# Patient Record
Sex: Female | Born: 1955 | Race: Black or African American | Hispanic: No | Marital: Single | Smoking: Former smoker
Health system: Southern US, Community
[De-identification: ages and names within clinical notes are randomized; demographics above are authoritative.]

---

## 1999-07-29 ENCOUNTER — Other Ambulatory Visit: Admission: RE | Admit: 1999-07-29 | Discharge: 1999-07-29 | Payer: Self-pay | Admitting: *Deleted

## 2002-06-07 ENCOUNTER — Other Ambulatory Visit: Admission: RE | Admit: 2002-06-07 | Discharge: 2002-06-07 | Payer: Self-pay | Admitting: Radiology

## 2004-04-14 ENCOUNTER — Other Ambulatory Visit: Admission: RE | Admit: 2004-04-14 | Discharge: 2004-04-14 | Payer: Self-pay | Admitting: Family Medicine

## 2006-02-10 ENCOUNTER — Other Ambulatory Visit: Admission: RE | Admit: 2006-02-10 | Discharge: 2006-02-10 | Payer: Self-pay | Admitting: Family Medicine

## 2007-11-04 ENCOUNTER — Other Ambulatory Visit: Admission: RE | Admit: 2007-11-04 | Discharge: 2007-11-04 | Payer: Self-pay | Admitting: Family Medicine

## 2008-08-03 ENCOUNTER — Encounter: Admission: RE | Admit: 2008-08-03 | Discharge: 2008-08-03 | Payer: Self-pay | Admitting: Family Medicine

## 2010-04-02 ENCOUNTER — Encounter: Admission: RE | Admit: 2010-04-02 | Discharge: 2010-04-02 | Payer: Self-pay | Admitting: Infectious Diseases

## 2011-04-18 ENCOUNTER — Emergency Department (HOSPITAL_COMMUNITY)
Admission: EM | Admit: 2011-04-18 | Discharge: 2011-04-19 | Disposition: A | Discharge status: Home / Self Care | Payer: 59 | Attending: Emergency Medicine | Admitting: Emergency Medicine

## 2011-04-18 ENCOUNTER — Emergency Department (HOSPITAL_COMMUNITY): Payer: 59

## 2011-04-18 DIAGNOSIS — R079 Chest pain, unspecified: Secondary | ICD-10-CM | POA: Insufficient documentation

## 2011-04-18 DIAGNOSIS — R51 Headache: Secondary | ICD-10-CM | POA: Insufficient documentation

## 2011-04-18 DIAGNOSIS — R5381 Other malaise: Secondary | ICD-10-CM | POA: Insufficient documentation

## 2011-04-18 LAB — POCT CARDIAC MARKERS
CKMB, poc: 2.1 ng/mL (ref 1.0–8.0)
Myoglobin, poc: 67.8 ng/mL (ref 12–200)
Troponin i, poc: <0.05 ng/mL (ref 0.00–0.09)

## 2011-04-18 LAB — CBC
MCV: 90 fL (ref 78.0–100.0)
Platelets: 253 10*3/uL (ref 150–400)
RDW: 13.2 % (ref 11.5–15.5)
WBC: 4.3 10*3/uL (ref 4.0–10.5)

## 2011-04-18 LAB — COMPREHENSIVE METABOLIC PANEL
Albumin: 3.8 g/dL (ref 3.5–5.2)
BUN: 6 mg/dL (ref 6–23)
Chloride: 101 mEq/L (ref 96–112)
Creatinine, Ser: 0.5 mg/dL (ref 0.4–1.2)
Total Bilirubin: 0.3 mg/dL (ref 0.3–1.2)

## 2011-04-18 LAB — PROTIME-INR
INR: 0.94 (ref 0.00–1.49)
Prothrombin Time: 12.8 seconds (ref 11.6–15.2)

## 2011-04-18 LAB — DIFFERENTIAL
Basophils Absolute: 0 10*3/uL (ref 0.0–0.1)
Eosinophils Absolute: 0 10*3/uL (ref 0.0–0.7)
Monocytes Absolute: 0.2 10*3/uL (ref 0.1–1.0)
Neutrophils Relative %: 51 % (ref 43–77)

## 2011-04-18 LAB — APTT: aPTT: 33 seconds (ref 24–37)

## 2011-04-18 LAB — POCT I-STAT, CHEM 8
BUN: 5 mg/dL — ABNORMAL LOW (ref 6–23)
Chloride: 102 mEq/L (ref 96–112)
Creatinine, Ser: 0.9 mg/dL (ref 0.4–1.2)
Sodium: 137 mEq/L (ref 135–145)

## 2011-04-19 LAB — URINALYSIS, ROUTINE W REFLEX MICROSCOPIC
Hgb urine dipstick: NEGATIVE
Nitrite: NEGATIVE
Specific Gravity, Urine: 1.006 (ref 1.005–1.030)
Urobilinogen, UA: 0.2 mg/dL (ref 0.0–1.0)

## 2012-02-07 IMAGING — CR DG CHEST 1V PORT
1 series · 1 of 1 positions shown · non-contrast
Comparison: None.

CLINICAL DATA: Chest pain

PORTABLE CHEST - 1 VIEW

[AP]
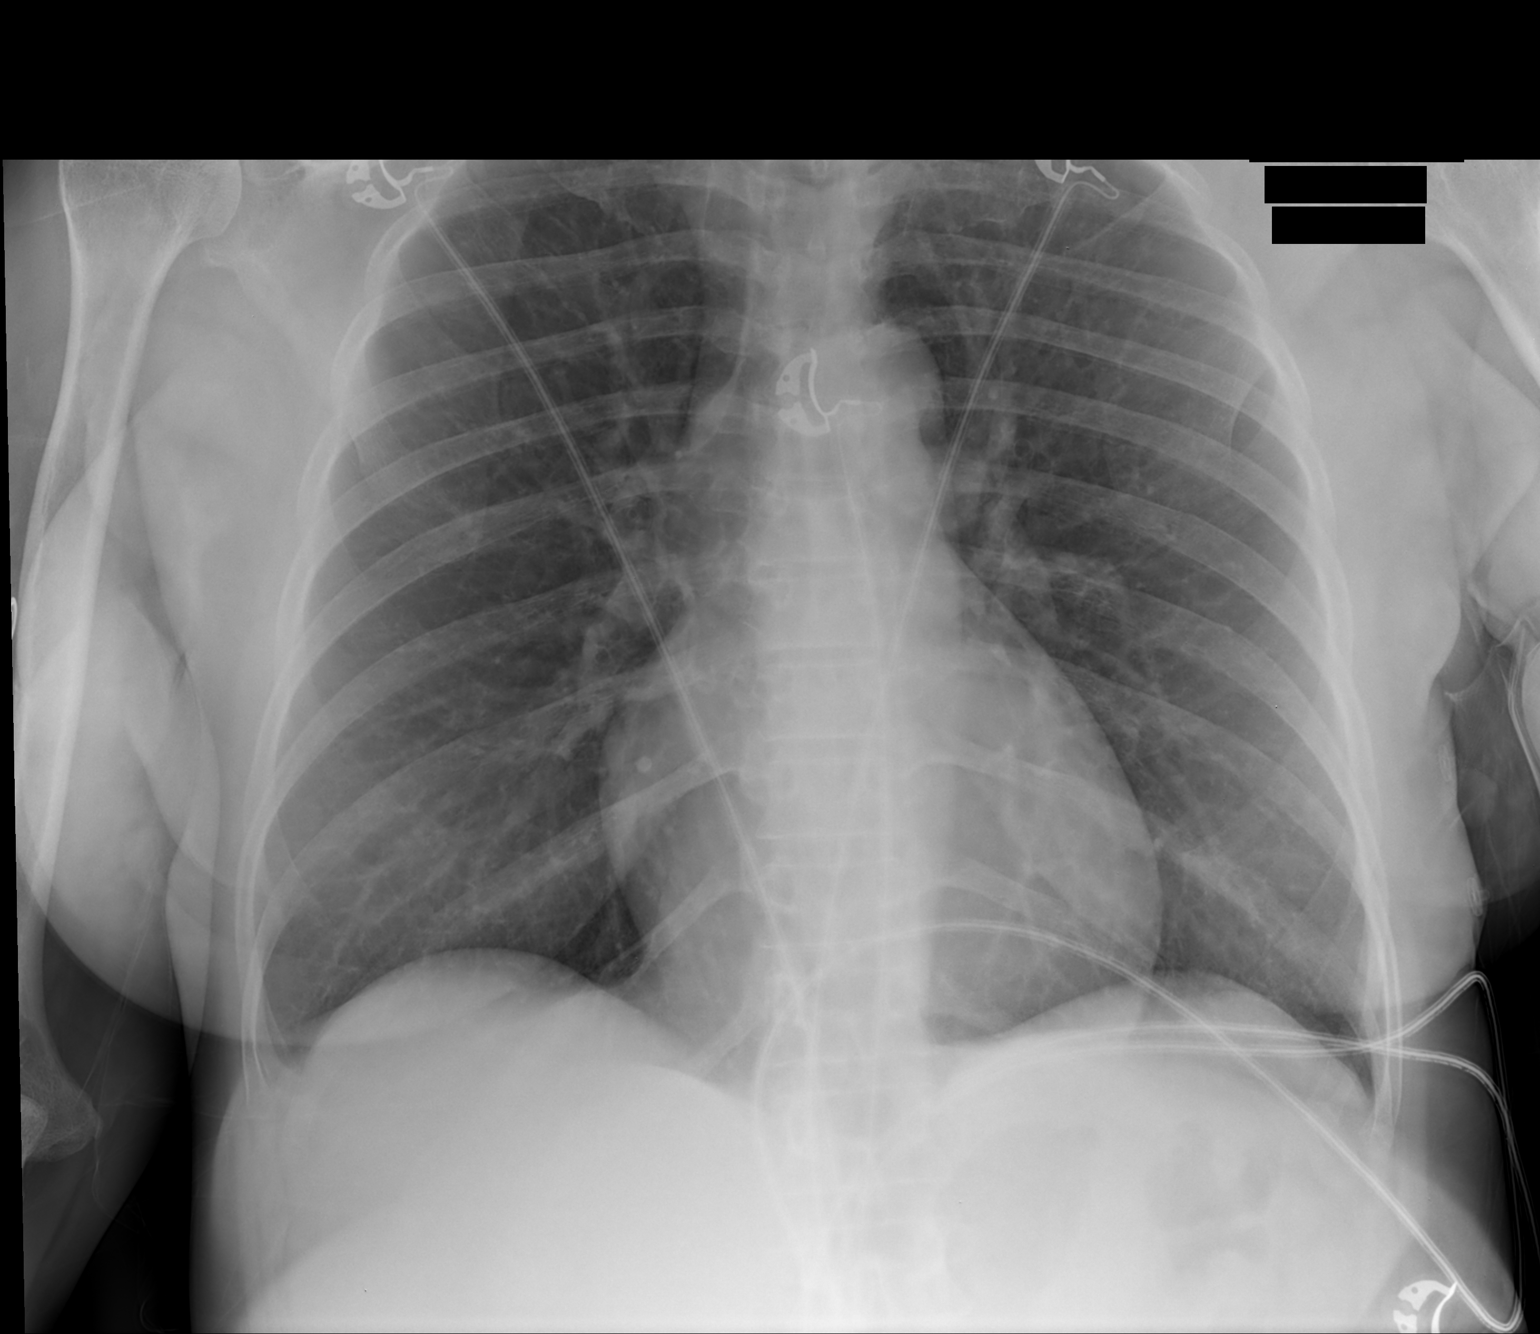

[1 of 1 positions shown; findings below may reference images not displayed]

FINDINGS: Lungs are clear. No pleural effusion or pneumothorax.

Cardiomediastinal silhouette is within normal limits.

Visualized osseous structures are within normal limits.
IMPRESSION: No evidence of acute cardiopulmonary disease.

## 2014-09-17 ENCOUNTER — Ambulatory Visit (INDEPENDENT_AMBULATORY_CARE_PROVIDER_SITE_OTHER): Payer: BC Managed Care – PPO | Admitting: Podiatry

## 2014-09-17 ENCOUNTER — Ambulatory Visit (INDEPENDENT_AMBULATORY_CARE_PROVIDER_SITE_OTHER): Payer: BC Managed Care – PPO

## 2014-09-17 ENCOUNTER — Encounter: Payer: Self-pay | Admitting: Podiatry

## 2014-09-17 VITALS — BP 140/85 | HR 83 | Resp 15 | Ht 61.5 in | Wt 153.0 lb

## 2014-09-17 DIAGNOSIS — M79671 Pain in right foot: Secondary | ICD-10-CM

## 2014-09-17 DIAGNOSIS — M722 Plantar fascial fibromatosis: Secondary | ICD-10-CM

## 2014-09-17 NOTE — Patient Instructions (Signed)
Bent - Knee Calf Stretch  1) Stand an arm's length away from a wall. Place the palms of your hands on the wall. Step forward about 12 inches with the opposite foot.  2) Keeping toes pointed forward and both heels on the floor, bend both knees and lean forward. Hold this position for 60 seconds. Don't arch your back and don't hunch your shoulders.  3) Repeat this twice.  DO THIS STRETCHING TECHNIQUE 3 TIMES A DAY.   Stretching Exercises before Standing      Pull your toes up toward your nose and hold for 1 minute before standing.  A towel can assist with this exercise if you put the towel under the ball of your foot. This exercise reduces the intense    pain associated when changing from a seated to a standing position. This stretch can usually be the most beneficial if done before getting out of bed in the mornings. Plantar Fasciitis Plantar fasciitis is a common condition that causes foot pain. It is soreness (inflammation) of the band of tough fibrous tissue on the bottom of the foot that runs from the heel bone (calcaneus) to the ball of the foot. The cause of this soreness may be from excessive standing, poor fitting shoes, running on hard surfaces, being overweight, having an abnormal walk, or overuse (this is common in runners) of the painful foot or feet. It is also common in aerobic exercise dancers and ballet dancers. SYMPTOMS  Most people with plantar fasciitis complain of:  Severe pain in the morning on the bottom of their foot especially when taking the first steps out of bed. This pain recedes after a few minutes of walking.  Severe pain is experienced also during walking following a long period of inactivity.  Pain is worse when walking barefoot or up stairs DIAGNOSIS   Your caregiver will diagnose this condition by examining and feeling your foot.  Special tests such as X-rays of your foot, are usually not needed. PREVENTION   Consult a sports medicine professional  before beginning a new exercise program.  Walking programs offer a good workout. With walking there is a lower chance of overuse injuries common to runners. There is less impact and less jarring of the joints.  Begin all new exercise programs slowly. If problems or pain develop, decrease the amount of time or distance until you are at a comfortable level.  Wear good shoes and replace them regularly.  Stretch your foot and the heel cords at the back of the ankle (Achilles tendon) both before and after exercise.  Run or exercise on even surfaces that are not hard. For example, asphalt is better than pavement.  Do not run barefoot on hard surfaces.  If using a treadmill, vary the incline.  Do not continue to workout if you have foot or joint problems. Seek professional help if they do not improve. HOME CARE INSTRUCTIONS   Avoid activities that cause you pain until you recover.  Use ice or cold packs on the problem or painful areas after working out.  Only take over-the-counter or prescription medicines for pain, discomfort, or fever as directed by your caregiver.  Soft shoe inserts or athletic shoes with air or gel sole cushions may be helpful.  If problems continue or become more severe, consult a sports medicine caregiver or your own health care provider. Cortisone is a potent anti-inflammatory medication that may be injected into the painful area. You can discuss this treatment with your caregiver. MAKE   SURE YOU:   Understand these instructions.  Will watch your condition.  Will get help right away if you are not doing well or get worse. Document Released: 08/11/2001 Document Revised: 02/08/2012 Document Reviewed: 10/10/2008 [employer] Patient Information 2015 [employer]. This information is not intended to replace advice given to you by your health care provider. Make sure you discuss any questions you have with your health care provider.  

## 2014-09-17 NOTE — Progress Notes (Signed)
   Subjective:    Patient ID: Latoya Scott, female    DOB: November 05, 1956, 58 y.o.   MRN: [id number]  HPI Comments: N heel pain L left right medial heel  D 5 - 6 months O  C sharp pain A walking  T none  Patient presents today complaining of right inferior heel pain upon standing walking relieved with rest she changed shoes and is wearing soft over-the-counter cushions in the right heel with minimal improvement of right heel pain.  She also complaining of a painful keratoses on the plantar left heel  Review of Systems  All other systems reviewed and are negative.      Objective:   Physical Exam  Orientated x3  Vascular: DP and PT pulses 2/4 bilaterally  Dermatological: Nucleated plantar keratoses left heel  Musculoskeletal: Palpable tenderness medial plantar insertional area right without any palpable lesions  Neurological: Ankle reflex equal and reactive bilaterally  X-ray examination right foot  Intact bony structure without fracture and/or dislocation  Inferior calcaneal spur  Radiographic impression:  No acute bony abnormality noted  Inferior calcaneal spur        Assessment & Plan:   Assessment: Plantar fasciitis right Porokeratosis left  Plan: Offered patient Kenalog injection she verbally consents Skin is prepped with alcohol and Betadine and 10 mg of plain Kenalog mixed with 10 mg of plain Xylocaine and 2.5 mg of plain Marcaine are injected inferior heel right for Kenalog injection #1  Bent knee stretching prescribed Discuss shoeing  The porokeratosis on the left was debrided  Reappoint if symptoms do not improve in the next 30 days

## 2014-09-18 DIAGNOSIS — M722 Plantar fascial fibromatosis: Secondary | ICD-10-CM

## 2014-09-18 MED ORDER — TRIAMCINOLONE ACETONIDE 10 MG/ML IJ SUSP
10.0000 mg | Freq: Once | INTRAMUSCULAR | Status: AC
  Administered 2014-09-18: 10 mg

## 2016-04-24 DIAGNOSIS — M25512 Pain in left shoulder: Secondary | ICD-10-CM | POA: Diagnosis not present

## 2016-07-01 ENCOUNTER — Other Ambulatory Visit (HOSPITAL_COMMUNITY)
Admission: RE | Admit: 2016-07-01 | Discharge: 2016-07-01 | Disposition: A | Discharge status: Home / Self Care | Payer: BLUE CROSS/BLUE SHIELD | Source: Ambulatory Visit | Attending: Family Medicine | Admitting: Family Medicine

## 2016-07-01 ENCOUNTER — Other Ambulatory Visit: Payer: Self-pay | Admitting: Family Medicine

## 2016-07-01 DIAGNOSIS — Z0000 Encounter for general adult medical examination without abnormal findings: Secondary | ICD-10-CM | POA: Diagnosis not present

## 2016-07-01 DIAGNOSIS — D179 Benign lipomatous neoplasm, unspecified: Secondary | ICD-10-CM | POA: Diagnosis not present

## 2016-07-01 DIAGNOSIS — Z23 Encounter for immunization: Secondary | ICD-10-CM | POA: Diagnosis not present

## 2016-07-01 DIAGNOSIS — Z01419 Encounter for gynecological examination (general) (routine) without abnormal findings: Secondary | ICD-10-CM | POA: Diagnosis not present

## 2016-07-01 DIAGNOSIS — Z124 Encounter for screening for malignant neoplasm of cervix: Secondary | ICD-10-CM | POA: Diagnosis not present

## 2016-07-03 LAB — CYTOLOGY - PAP

## 2016-07-14 DIAGNOSIS — D1739 Benign lipomatous neoplasm of skin and subcutaneous tissue of other sites: Secondary | ICD-10-CM | POA: Diagnosis not present

## 2016-07-14 DIAGNOSIS — D171 Benign lipomatous neoplasm of skin and subcutaneous tissue of trunk: Secondary | ICD-10-CM | POA: Diagnosis not present

## 2016-09-16 ENCOUNTER — Other Ambulatory Visit: Payer: Self-pay | Admitting: General Surgery

## 2016-09-16 DIAGNOSIS — D171 Benign lipomatous neoplasm of skin and subcutaneous tissue of trunk: Secondary | ICD-10-CM | POA: Diagnosis not present

## 2016-09-16 DIAGNOSIS — D1739 Benign lipomatous neoplasm of skin and subcutaneous tissue of other sites: Secondary | ICD-10-CM | POA: Diagnosis not present

## 2016-09-20 NOTE — Progress Notes (Signed)
Inform patient of Pathology report,. "benign lipoma.NO CANCER"  Arrange for nurse only visit for wound check and give her path report. I can see if needed but should be routine.  Derrell LollingIngram

## 2016-10-03 DIAGNOSIS — Z23 Encounter for immunization: Secondary | ICD-10-CM | POA: Diagnosis not present

## 2016-10-10 DIAGNOSIS — Z1231 Encounter for screening mammogram for malignant neoplasm of breast: Secondary | ICD-10-CM | POA: Diagnosis not present

## 2016-11-19 ENCOUNTER — Encounter (INDEPENDENT_AMBULATORY_CARE_PROVIDER_SITE_OTHER): Payer: BLUE CROSS/BLUE SHIELD | Admitting: Ophthalmology

## 2016-11-19 DIAGNOSIS — H43813 Vitreous degeneration, bilateral: Secondary | ICD-10-CM | POA: Diagnosis not present

## 2016-11-19 DIAGNOSIS — H33301 Unspecified retinal break, right eye: Secondary | ICD-10-CM

## 2016-11-19 DIAGNOSIS — H4611 Retrobulbar neuritis, right eye: Secondary | ICD-10-CM

## 2016-11-20 DIAGNOSIS — H469 Unspecified optic neuritis: Secondary | ICD-10-CM | POA: Diagnosis not present

## 2016-11-20 DIAGNOSIS — H547 Unspecified visual loss: Secondary | ICD-10-CM | POA: Diagnosis not present

## 2016-11-20 DIAGNOSIS — H2513 Age-related nuclear cataract, bilateral: Secondary | ICD-10-CM | POA: Diagnosis not present

## 2016-11-20 DIAGNOSIS — H5461 Unqualified visual loss, right eye, normal vision left eye: Secondary | ICD-10-CM | POA: Diagnosis not present

## 2016-11-21 DIAGNOSIS — H469 Unspecified optic neuritis: Secondary | ICD-10-CM | POA: Diagnosis not present

## 2016-11-21 DIAGNOSIS — H547 Unspecified visual loss: Secondary | ICD-10-CM | POA: Diagnosis not present

## 2016-11-22 DIAGNOSIS — Z79899 Other long term (current) drug therapy: Secondary | ICD-10-CM | POA: Diagnosis not present

## 2016-11-22 DIAGNOSIS — H469 Unspecified optic neuritis: Secondary | ICD-10-CM | POA: Diagnosis not present

## 2016-11-23 DIAGNOSIS — Z79899 Other long term (current) drug therapy: Secondary | ICD-10-CM | POA: Diagnosis not present

## 2016-11-23 DIAGNOSIS — H469 Unspecified optic neuritis: Secondary | ICD-10-CM | POA: Diagnosis not present

## 2016-11-24 DIAGNOSIS — H469 Unspecified optic neuritis: Secondary | ICD-10-CM | POA: Diagnosis not present

## 2016-11-24 DIAGNOSIS — Z79899 Other long term (current) drug therapy: Secondary | ICD-10-CM | POA: Diagnosis not present

## 2016-11-25 DIAGNOSIS — Z79899 Other long term (current) drug therapy: Secondary | ICD-10-CM | POA: Diagnosis not present

## 2016-11-25 DIAGNOSIS — H469 Unspecified optic neuritis: Secondary | ICD-10-CM | POA: Diagnosis not present

## 2017-02-01 DIAGNOSIS — H40033 Anatomical narrow angle, bilateral: Secondary | ICD-10-CM | POA: Diagnosis not present

## 2017-02-01 DIAGNOSIS — H1013 Acute atopic conjunctivitis, bilateral: Secondary | ICD-10-CM | POA: Diagnosis not present

## 2017-03-03 DIAGNOSIS — Z87891 Personal history of nicotine dependence: Secondary | ICD-10-CM | POA: Diagnosis not present

## 2017-03-03 DIAGNOSIS — G35 Multiple sclerosis: Secondary | ICD-10-CM | POA: Diagnosis not present

## 2017-03-03 DIAGNOSIS — G379 Demyelinating disease of central nervous system, unspecified: Secondary | ICD-10-CM | POA: Diagnosis not present

## 2017-05-25 ENCOUNTER — Ambulatory Visit (INDEPENDENT_AMBULATORY_CARE_PROVIDER_SITE_OTHER): Payer: BLUE CROSS/BLUE SHIELD | Admitting: Ophthalmology

## 2017-07-07 DIAGNOSIS — Z0000 Encounter for general adult medical examination without abnormal findings: Secondary | ICD-10-CM | POA: Diagnosis not present

## 2017-07-07 DIAGNOSIS — Z13220 Encounter for screening for lipoid disorders: Secondary | ICD-10-CM | POA: Diagnosis not present

## 2017-07-07 DIAGNOSIS — Z1159 Encounter for screening for other viral diseases: Secondary | ICD-10-CM | POA: Diagnosis not present

## 2017-07-07 DIAGNOSIS — Z131 Encounter for screening for diabetes mellitus: Secondary | ICD-10-CM | POA: Diagnosis not present

## 2017-10-30 DIAGNOSIS — Z1231 Encounter for screening mammogram for malignant neoplasm of breast: Secondary | ICD-10-CM | POA: Diagnosis not present

## 2017-12-30 DIAGNOSIS — K0889 Other specified disorders of teeth and supporting structures: Secondary | ICD-10-CM | POA: Diagnosis not present

## 2017-12-30 DIAGNOSIS — K047 Periapical abscess without sinus: Secondary | ICD-10-CM | POA: Diagnosis not present

## 2018-02-10 DIAGNOSIS — Z1211 Encounter for screening for malignant neoplasm of colon: Secondary | ICD-10-CM | POA: Diagnosis not present

## 2018-02-10 DIAGNOSIS — D126 Benign neoplasm of colon, unspecified: Secondary | ICD-10-CM | POA: Diagnosis not present

## 2018-02-15 DIAGNOSIS — D126 Benign neoplasm of colon, unspecified: Secondary | ICD-10-CM | POA: Diagnosis not present

## 2018-10-10 DIAGNOSIS — Z0000 Encounter for general adult medical examination without abnormal findings: Secondary | ICD-10-CM | POA: Diagnosis not present

## 2018-10-10 DIAGNOSIS — Z13220 Encounter for screening for lipoid disorders: Secondary | ICD-10-CM | POA: Diagnosis not present

## 2018-10-10 DIAGNOSIS — R42 Dizziness and giddiness: Secondary | ICD-10-CM | POA: Diagnosis not present

## 2018-10-20 DIAGNOSIS — M79671 Pain in right foot: Secondary | ICD-10-CM | POA: Diagnosis not present

## 2018-11-12 DIAGNOSIS — Z1231 Encounter for screening mammogram for malignant neoplasm of breast: Secondary | ICD-10-CM | POA: Diagnosis not present

## 2018-12-15 DIAGNOSIS — H8309 Labyrinthitis, unspecified ear: Secondary | ICD-10-CM | POA: Diagnosis not present

## 2018-12-15 DIAGNOSIS — L209 Atopic dermatitis, unspecified: Secondary | ICD-10-CM | POA: Diagnosis not present

## 2019-02-08 DIAGNOSIS — H04123 Dry eye syndrome of bilateral lacrimal glands: Secondary | ICD-10-CM | POA: Diagnosis not present

## 2019-02-08 DIAGNOSIS — H40033 Anatomical narrow angle, bilateral: Secondary | ICD-10-CM | POA: Diagnosis not present

## 2019-10-13 DIAGNOSIS — Z0000 Encounter for general adult medical examination without abnormal findings: Secondary | ICD-10-CM | POA: Diagnosis not present

## 2019-10-13 DIAGNOSIS — Z13220 Encounter for screening for lipoid disorders: Secondary | ICD-10-CM | POA: Diagnosis not present

## 2019-11-16 DIAGNOSIS — Z1231 Encounter for screening mammogram for malignant neoplasm of breast: Secondary | ICD-10-CM | POA: Diagnosis not present

## 2020-10-18 DIAGNOSIS — Z131 Encounter for screening for diabetes mellitus: Secondary | ICD-10-CM | POA: Diagnosis not present

## 2020-10-18 DIAGNOSIS — Z13220 Encounter for screening for lipoid disorders: Secondary | ICD-10-CM | POA: Diagnosis not present

## 2020-10-18 DIAGNOSIS — Z0000 Encounter for general adult medical examination without abnormal findings: Secondary | ICD-10-CM | POA: Diagnosis not present

## 2020-10-18 DIAGNOSIS — Z124 Encounter for screening for malignant neoplasm of cervix: Secondary | ICD-10-CM | POA: Diagnosis not present

## 2020-11-29 DIAGNOSIS — Z1231 Encounter for screening mammogram for malignant neoplasm of breast: Secondary | ICD-10-CM | POA: Diagnosis not present

## 2021-10-21 DIAGNOSIS — Z131 Encounter for screening for diabetes mellitus: Secondary | ICD-10-CM | POA: Diagnosis not present

## 2021-10-21 DIAGNOSIS — Z13220 Encounter for screening for lipoid disorders: Secondary | ICD-10-CM | POA: Diagnosis not present

## 2021-10-21 DIAGNOSIS — Z0000 Encounter for general adult medical examination without abnormal findings: Secondary | ICD-10-CM | POA: Diagnosis not present

## 2022-10-30 DIAGNOSIS — Z23 Encounter for immunization: Secondary | ICD-10-CM | POA: Diagnosis not present

## 2022-10-30 DIAGNOSIS — E785 Hyperlipidemia, unspecified: Secondary | ICD-10-CM | POA: Diagnosis not present

## 2022-10-30 DIAGNOSIS — Z0000 Encounter for general adult medical examination without abnormal findings: Secondary | ICD-10-CM | POA: Diagnosis not present

## 2022-10-30 DIAGNOSIS — Z131 Encounter for screening for diabetes mellitus: Secondary | ICD-10-CM | POA: Diagnosis not present

## 2022-11-17 DIAGNOSIS — R7301 Impaired fasting glucose: Secondary | ICD-10-CM | POA: Diagnosis not present

## 2022-11-17 DIAGNOSIS — Z131 Encounter for screening for diabetes mellitus: Secondary | ICD-10-CM | POA: Diagnosis not present

## 2023-11-05 DIAGNOSIS — Z0000 Encounter for general adult medical examination without abnormal findings: Secondary | ICD-10-CM | POA: Diagnosis not present

## 2023-11-05 DIAGNOSIS — E785 Hyperlipidemia, unspecified: Secondary | ICD-10-CM | POA: Diagnosis not present

## 2023-11-05 DIAGNOSIS — R7303 Prediabetes: Secondary | ICD-10-CM | POA: Diagnosis not present

## 2024-01-07 DIAGNOSIS — M8588 Other specified disorders of bone density and structure, other site: Secondary | ICD-10-CM | POA: Diagnosis not present

## 2024-01-07 DIAGNOSIS — Z1231 Encounter for screening mammogram for malignant neoplasm of breast: Secondary | ICD-10-CM | POA: Diagnosis not present

## 2024-01-07 DIAGNOSIS — E2839 Other primary ovarian failure: Secondary | ICD-10-CM | POA: Diagnosis not present
